# Patient Record
Sex: Male | Born: 1960 | Race: White | Hispanic: No | Marital: Single | Smoking: Never smoker
Health system: Southern US, Community
[De-identification: ages and names within clinical notes are randomized; demographics above are authoritative.]

---

## 2014-05-08 ENCOUNTER — Ambulatory Visit (INDEPENDENT_AMBULATORY_CARE_PROVIDER_SITE_OTHER): Payer: Self-pay | Admitting: General Surgery

## 2014-12-04 ENCOUNTER — Encounter: Payer: Self-pay | Admitting: Internal Medicine

## 2017-01-04 DIAGNOSIS — Z0000 Encounter for general adult medical examination without abnormal findings: Secondary | ICD-10-CM | POA: Diagnosis not present

## 2017-01-04 DIAGNOSIS — I10 Essential (primary) hypertension: Secondary | ICD-10-CM | POA: Diagnosis not present

## 2017-01-04 DIAGNOSIS — Z125 Encounter for screening for malignant neoplasm of prostate: Secondary | ICD-10-CM | POA: Diagnosis not present

## 2017-01-11 DIAGNOSIS — Z23 Encounter for immunization: Secondary | ICD-10-CM | POA: Diagnosis not present

## 2017-01-11 DIAGNOSIS — K5790 Diverticulosis of intestine, part unspecified, without perforation or abscess without bleeding: Secondary | ICD-10-CM | POA: Diagnosis not present

## 2017-01-11 DIAGNOSIS — E668 Other obesity: Secondary | ICD-10-CM | POA: Diagnosis not present

## 2017-01-11 DIAGNOSIS — Z1389 Encounter for screening for other disorder: Secondary | ICD-10-CM | POA: Diagnosis not present

## 2017-01-11 DIAGNOSIS — I10 Essential (primary) hypertension: Secondary | ICD-10-CM | POA: Diagnosis not present

## 2017-01-11 DIAGNOSIS — E7849 Other hyperlipidemia: Secondary | ICD-10-CM | POA: Diagnosis not present

## 2017-01-11 DIAGNOSIS — Z0000 Encounter for general adult medical examination without abnormal findings: Secondary | ICD-10-CM | POA: Diagnosis not present

## 2017-01-14 DIAGNOSIS — Z1212 Encounter for screening for malignant neoplasm of rectum: Secondary | ICD-10-CM | POA: Diagnosis not present

## 2017-10-30 ENCOUNTER — Emergency Department (HOSPITAL_COMMUNITY): Payer: Commercial Managed Care - PPO

## 2017-10-30 ENCOUNTER — Emergency Department (HOSPITAL_COMMUNITY)
Admission: EM | Admit: 2017-10-30 | Discharge: 2017-10-30 | Disposition: A | Discharge status: Home / Self Care | Payer: Commercial Managed Care - PPO | Attending: Emergency Medicine | Admitting: Emergency Medicine

## 2017-10-30 ENCOUNTER — Encounter (HOSPITAL_COMMUNITY): Payer: Self-pay

## 2017-10-30 ENCOUNTER — Other Ambulatory Visit: Payer: Self-pay

## 2017-10-30 DIAGNOSIS — Y929 Unspecified place or not applicable: Secondary | ICD-10-CM | POA: Diagnosis not present

## 2017-10-30 DIAGNOSIS — Y939 Activity, unspecified: Secondary | ICD-10-CM | POA: Diagnosis not present

## 2017-10-30 DIAGNOSIS — S61210A Laceration without foreign body of right index finger without damage to nail, initial encounter: Secondary | ICD-10-CM | POA: Diagnosis present

## 2017-10-30 DIAGNOSIS — Z23 Encounter for immunization: Secondary | ICD-10-CM | POA: Insufficient documentation

## 2017-10-30 DIAGNOSIS — W312XXA Contact with powered woodworking and forming machines, initial encounter: Secondary | ICD-10-CM | POA: Diagnosis not present

## 2017-10-30 DIAGNOSIS — Z79899 Other long term (current) drug therapy: Secondary | ICD-10-CM | POA: Insufficient documentation

## 2017-10-30 DIAGNOSIS — Y999 Unspecified external cause status: Secondary | ICD-10-CM | POA: Diagnosis not present

## 2017-10-30 MED ORDER — OXYCODONE-ACETAMINOPHEN 5-325 MG PO TABS: 1.0000 | ORAL_TABLET | Freq: Four times a day (QID) | ORAL | 0 refills | Status: AC | PRN

## 2017-10-30 MED ORDER — BACITRACIN ZINC 500 UNIT/GM EX OINT
TOPICAL_OINTMENT | Freq: Two times a day (BID) | CUTANEOUS | Status: DC
  Administered 2017-10-30: 1 via TOPICAL
  Filled 2017-10-30: qty 0.9

## 2017-10-30 MED ORDER — TETANUS-DIPHTH-ACELL PERTUSSIS 5-2.5-18.5 LF-MCG/0.5 IM SUSP
0.5000 mL | Freq: Once | INTRAMUSCULAR | Status: AC
  Administered 2017-10-30: 0.5 mL via INTRAMUSCULAR
  Filled 2017-10-30 (×2): qty 0.5

## 2017-10-30 MED ORDER — CEPHALEXIN 500 MG PO CAPS: 500.0000 mg | ORAL_CAPSULE | Freq: Four times a day (QID) | ORAL | 0 refills | Status: AC

## 2017-10-30 MED ORDER — LIDOCAINE HCL (PF) 1 % IJ SOLN
5.0000 mL | Freq: Once | INTRAMUSCULAR | Status: AC
  Administered 2017-10-30: 5 mL
  Filled 2017-10-30: qty 30

## 2017-10-30 NOTE — ED Notes (Signed)
Pt family member updated on status of provider suturing wound

## 2017-10-30 NOTE — Discharge Instructions (Addendum)
Please follow the wound care instructions attached to the your discharge paperwork. Please take all of your antibiotic, Keflex as prescribed. You may use the pain medication, Percocet as prescribed for severe pain, do not drive or operate heavy machinery while you take this medication because it can make you drowsy.  You may also use over-the-counter anti-inflammatory medications such as ibuprofen for pain as directed on the package. Follow-up with your primary care provider in 1 week to have your wound checked and sutures removed. Please return to the emergency department for any new or worsening symptoms.  Contact a health care provider if: You received a tetanus shot and you have swelling, severe pain, redness, or bleeding at the injection site. You have a fever. A wound that was closed breaks open. You notice a bad smell coming from the wound. You notice something coming out of the wound, such as wood or glass. Your pain is not controlled with medicine. You have increased redness, swelling, or pain at the site of your wound. You have fluid, blood, or pus coming from your wound. You notice a change in the color of your skin near your wound. You need to change the dressing frequently due to fluid, blood, or pus draining from the wound. You develop a new rash. You develop numbness around the wound. Get help right away if: You develop severe swelling around the injury site. Your pain suddenly increases and is severe. You develop painful lumps near the wound or on skin that is anywhere on your body. You have a red streak going away from your wound. The wound is on your hand or foot and you cannot properly move a finger or toe. The wound is on your hand or foot and you notice that your fingers or toes look pale or bluish.

## 2017-10-30 NOTE — ED Provider Notes (Signed)
  Face-to-face evaluation   History: Patient, and with chainsaw, accidentally today.  Isolated injury, right base of index finger. Physical exam: Alert, calm, cooperative.  Parallel lacerations radial base of the index finger.  Normal distal motion and capillary refill.  Medical screening examination/treatment/procedure(s) were conducted as a shared visit with non-physician practitioner(s) and myself.  I personally evaluated the patient during the encounter    Mancel BaleWentz, Jonita Hirota, MD 10/30/17 [id number]

## 2017-10-30 NOTE — ED Notes (Signed)
ED Provider at bedside. 

## 2017-10-30 NOTE — ED Triage Notes (Signed)
He states he lac. His right index finger while using a chainsaw this morning. He has a lac. At proximal ant. Right index finger. He is in no distress.

## 2017-10-30 NOTE — ED Provider Notes (Signed)
[facility]-EMERGENCY DEPT Provider Note   CSN: [id number] Arrival date & time: 10/30/17  1053     History   Chief Complaint Chief Complaint  Patient presents with  . Extremity Laceration    HPI Jesse Knapp is a 57 y.o. male presenting for laceration to his proximal right index finger just prior to arrival with a chainsaw.  Patient has multiple parallel lacerations to the proximal right index finger, bleeding controlled prior to arrival with direct pressure.  Patient describes pain as sharp, 5/10 in severity worsened with touching the wound and movement of the finger.  Patient states that he has not taken anything for his pain prior to arrival.  Patient denies all other injuries, denies use of blood thinner.  Patient states that he does not remember when his last tetanus shot was. HPI  No past medical history on file.  There are no active problems to display for this patient.     Home Medications    Prior to Admission medications   Medication Sig Start Date End Date Taking? Authorizing Provider  amLODipine (NORVASC) 10 MG tablet Take 5 mg by mouth daily.   Yes [provider]  aspirin (ASPIRIN 81) 81 MG EC tablet Take 81 mg by mouth daily.   Yes [provider]  aspirin 500 MG EC tablet Take 500 mg by mouth every 6 (six) hours as needed for pain.   Yes [provider]  atorvastatin (LIPITOR) 80 MG tablet Take 80 mg by mouth daily.   Yes [provider]  lisinopril (PRINIVIL,ZESTRIL) 40 MG tablet Take 40 mg by mouth daily.   Yes [provider]  Multiple Vitamin (MULTIVITAMIN) tablet Take 1 tablet by mouth daily.   Yes [provider]  cephALEXin (KEFLEX) 500 MG capsule Take 1 capsule (500 mg total) by mouth 4 (four) times daily for 7 days. 10/30/17 11/06/17  Harlene SaltsMorelli, Marsha Gundlach A, PA-C  oxyCODONE-acetaminophen (PERCOCET/ROXICET) 5-325 MG tablet Take 1 tablet by mouth every 6 (six) hours as needed for up to  3 days for severe pain. 10/30/17 11/02/17  Bill SalinasMorelli, Leonce Bale A, PA-C    Family History No family history on file.  Social History Social History   Tobacco Use  . Smoking status: Never Smoker  . Smokeless tobacco: Never Used  Substance Use Topics  . Alcohol use: Not on file  . Drug use: Not on file     Allergies   Ivp dye [iodinated diagnostic agents]   Review of Systems Review of Systems  Constitutional: Negative.  Negative for fever.  HENT: Negative.  Negative for sore throat and trouble swallowing.   Eyes: Negative.  Negative for visual disturbance.  Respiratory: Negative.  Negative for shortness of breath.   Cardiovascular: Negative.  Negative for chest pain.  Gastrointestinal: Negative.  Negative for abdominal pain, diarrhea, nausea and vomiting.  Genitourinary: Negative.  Negative for difficulty urinating and dysuria.  Musculoskeletal: Negative.  Negative for arthralgias and myalgias.  Skin: Positive for wound.  Neurological: Negative.  Negative for dizziness, syncope, weakness, numbness and headaches.    Physical Exam Updated Vital Signs BP (!) 150/89 (BP Location: Left Arm)   Pulse 75   Temp 98.5 F (36.9 C) (Oral)   Resp 16   SpO2 100%   Physical Exam  Constitutional: He is oriented to person, place, and time. He appears well-developed and well-nourished. No distress.  HENT:  Head: Normocephalic and atraumatic.  Right Ear: External ear normal.  Left Ear: External ear  normal.  Nose: Nose normal.  Mouth/Throat: Oropharynx is clear and moist.  Eyes: Pupils are equal, round, and reactive to light. EOM are normal.  Neck: Normal range of motion. Neck supple. No JVD present. No tracheal deviation present.  Cardiovascular: Normal rate, regular rhythm, normal heart sounds and intact distal pulses.  Pulmonary/Chest: Effort normal and breath sounds normal. No respiratory distress.  Abdominal: Soft. Bowel sounds are normal. There is no tenderness. There is no rebound  and no guarding.  Musculoskeletal: Normal range of motion.       Hands: 3 sets of parallel lacerations to the right proximal index finger, roughly Z shaped.  Patient with capillary refill intact to this finger, sensation intact, patient is able to extend and flex the finger.  5/5 strength with movement of this finger.  No TTP over flexor sheath.No snuffbox TTP. Finger adduction/abduction intact with 5/5 strength.  Thumb opposition intact. Full active and resisted ROM to flexion/extension at wrist, MCP, PIP and DIP of all fingers.  FDS/FDP intact. Radial artery 2+ with <2sec cap refill.  Dynamic 2-pt discrimination intact over median and ulnar nerve distributions on the ulnar/radial aspects of digits. Grip 5/5 strength.    Neurological: He is alert and oriented to person, place, and time.  Skin: Skin is warm and dry. Capillary refill takes less than 2 seconds.  Psychiatric: He has a normal mood and affect. His behavior is normal.     ED Treatments / Results  Labs (all labs ordered are listed, but only abnormal results are displayed) Labs Reviewed - No data to display  EKG None  Radiology Dg Finger Index Right  Result Date: 10/30/2017 CLINICAL DATA:  Laceration from chainsaw injury EXAM: RIGHT INDEX FINGER 2+V COMPARISON:  None. FINDINGS: Soft tissue laceration noted in the proximal right second finger, radial aspect. No radiopaque foreign body. No fracture. No dislocation. No suspicious focal osseous lesion. No significant arthropathy. IMPRESSION: Proximal right second finger soft tissue laceration, with no radiopaque foreign body, fracture or malalignment. Electronically Signed   By: Delbert Phenix M.D.   On: 10/30/2017 13:16    Procedures .Marland KitchenLaceration Repair Date/Time: 10/30/2017 3:42 PM Performed by: Bill Salinas, PA-C Authorized by: Bill Salinas, PA-C   Consent:    Consent obtained:  Verbal   Consent given by:  Patient   Risks discussed:  Infection, pain, retained  foreign body, tendon damage, poor cosmetic result, need for additional repair, poor wound healing, nerve damage and vascular damage Anesthesia (see MAR for exact dosages):    Anesthesia method:  Nerve block and local infiltration   Local anesthetic:  Lidocaine 1% w/o epi   Block location:  Wound   Block needle gauge:  27 G   Block anesthetic:  Lidocaine 1% w/o epi   Block technique:  Digital   Block injection procedure:  Anatomic landmarks identified, introduced needle, incremental injection, negative aspiration for blood and anatomic landmarks palpated   Block outcome:  Incomplete block Laceration details:    Location:  Finger   Finger location:  R index finger   Length (cm):  6 (2cm, 1.5cm, 1.5cm, 1cm)   Depth (mm):  3 Repair type:    Repair type:  Intermediate Pre-procedure details:    Preparation:  Patient was prepped and draped in usual sterile fashion and imaging obtained to evaluate for foreign bodies Exploration:    Hemostasis achieved with:  Direct pressure   Wound exploration: wound explored through full range of motion and entire depth of wound probed  and visualized     Wound extent: no foreign bodies/material noted, no muscle damage noted, no nerve damage noted, no tendon damage noted, no underlying fracture noted and no vascular damage noted     Contaminated: yes   Treatment:    Area cleansed with:  Saline   Amount of cleaning:  Extensive   Irrigation solution:  Sterile saline   Irrigation volume:  1 L   Irrigation method:  Pressure wash   Visualized foreign bodies/material removed: no   Skin repair:    Repair method:  Sutures   Suture size:  4-0   Suture material:  Prolene   Number of sutures:  6 Approximation:    Approximation:  Close Post-procedure details:    Dressing:  Antibiotic ointment, non-adherent dressing and sterile dressing   Patient tolerance of procedure:  Tolerated well, no immediate complications Comments:     Initial digital block only  partially successful, still needed to inject approximately 1 cc into the wound edges. This wound was complicated by the fact that it was due to chainsaw, multiple parallel lacerations grouped closely together in a Z shaped pattern. I was able to approximate the parallel lacerations with stitch through both.  Each set of lacerations required 2 sutures each crossing both parallel lacerations and then tied.  Dr.Wentz observed mid procedure and approved of technique. Patient was cleaned after procedure by nursing staff.  Bleeding still controlled at this time.  Patient with full range of motion, flexion and extension post procedure.  Patient with capillary refill intact post procedure. Patient with full sensation to the tip of his right index finger post procedure.  Patient expresses satisfaction with laceration repair.    Medications Ordered in ED Medications  bacitracin ointment (1 application Topical Given 10/30/17 1522)  lidocaine (PF) (XYLOCAINE) 1 % injection 5 mL (5 mLs Infiltration Given by Other 10/30/17 1257)  Tdap (BOOSTRIX) injection 0.5 mL (0.5 mLs Intramuscular Given 10/30/17 1522)     Initial Impression / Assessment and Plan / ED Course  I have reviewed the triage vital signs and the nursing notes.  Pertinent labs & imaging results that were available during my care of the patient were reviewed by me and considered in my medical decision making (see chart for details).    Jesse Knapp is a 57 y.o. male who presents to ED for laceration of the right proximal index finger. Wound thoroughly cleaned in ED today. Wound explored and bottom of wound seen in a bloodless field. Laceration repaired as dictated above. Patient counseled on home wound care. Follow up with PCP/urgent care or return to ER for suture removal in 7 days. Patient was urged to return to the Emergency Department for worsening pain, swelling, expanding erythema especially if it streaks away from the affected area, fever, or  for any additional concerns. Patient verbalized understanding of return precautions. All questions answered. Patient prescribed antibiotic medication Keflex for wound prophylaxis due to inherently dirty wound.  Chainsaw.  Patient also prescribed short prescription for Percocet for pain. Patient given tetanus shot in department today.  At this time there does not appear to be any evidence of an acute emergency medical condition and the patient appears stable for discharge with appropriate outpatient follow up. Diagnosis was discussed with patient who verbalizes understanding and is agreeable to discharge. I have discussed return precautions with patient and family who verbalize understanding of return precautions. Patient strongly encouraged to follow-up with their PCP.  Patient's case discussed with Dr. Effie Shy  who agrees with plan to discharge with follow-up.     This note was dictated using DragonOne dictation software; please contact for any inconsistencies within the note.    Final Clinical Impressions(s) / ED Diagnoses   Final diagnoses:  Laceration of right index finger without foreign body without damage to nail, initial encounter    ED Discharge Orders        Ordered    oxyCODONE-acetaminophen (PERCOCET/ROXICET) 5-325 MG tablet  Every 6 hours PRN     10/30/17 1512    cephALEXin (KEFLEX) 500 MG capsule  4 times daily     10/30/17 1512       Elizabeth Palau 10/30/17 1553    Mancel Bale, MD 10/30/17 [phone]

## 2018-08-18 DIAGNOSIS — I10 Essential (primary) hypertension: Secondary | ICD-10-CM | POA: Diagnosis not present

## 2018-08-21 DIAGNOSIS — Z1331 Encounter for screening for depression: Secondary | ICD-10-CM | POA: Diagnosis not present

## 2018-08-21 DIAGNOSIS — K5790 Diverticulosis of intestine, part unspecified, without perforation or abscess without bleeding: Secondary | ICD-10-CM | POA: Diagnosis not present

## 2018-08-21 DIAGNOSIS — E669 Obesity, unspecified: Secondary | ICD-10-CM | POA: Diagnosis not present

## 2018-08-21 DIAGNOSIS — E785 Hyperlipidemia, unspecified: Secondary | ICD-10-CM | POA: Diagnosis not present

## 2018-08-21 DIAGNOSIS — I10 Essential (primary) hypertension: Secondary | ICD-10-CM | POA: Diagnosis not present

## 2018-10-02 DIAGNOSIS — E669 Obesity, unspecified: Secondary | ICD-10-CM | POA: Diagnosis not present

## 2018-10-02 DIAGNOSIS — I10 Essential (primary) hypertension: Secondary | ICD-10-CM | POA: Diagnosis not present

## 2018-10-02 DIAGNOSIS — R252 Cramp and spasm: Secondary | ICD-10-CM | POA: Diagnosis not present

## 2018-11-09 DIAGNOSIS — E785 Hyperlipidemia, unspecified: Secondary | ICD-10-CM | POA: Diagnosis not present

## 2018-11-09 DIAGNOSIS — I10 Essential (primary) hypertension: Secondary | ICD-10-CM | POA: Diagnosis not present

## 2018-11-09 DIAGNOSIS — E669 Obesity, unspecified: Secondary | ICD-10-CM | POA: Diagnosis not present

## 2018-12-08 DIAGNOSIS — Z23 Encounter for immunization: Secondary | ICD-10-CM | POA: Diagnosis not present

## 2019-03-08 DIAGNOSIS — Z125 Encounter for screening for malignant neoplasm of prostate: Secondary | ICD-10-CM | POA: Diagnosis not present

## 2019-03-08 DIAGNOSIS — E7849 Other hyperlipidemia: Secondary | ICD-10-CM | POA: Diagnosis not present

## 2019-03-14 DIAGNOSIS — I10 Essential (primary) hypertension: Secondary | ICD-10-CM | POA: Diagnosis not present

## 2019-03-14 DIAGNOSIS — R82998 Other abnormal findings in urine: Secondary | ICD-10-CM | POA: Diagnosis not present

## 2019-03-15 DIAGNOSIS — Z1331 Encounter for screening for depression: Secondary | ICD-10-CM | POA: Diagnosis not present

## 2019-03-15 DIAGNOSIS — I10 Essential (primary) hypertension: Secondary | ICD-10-CM | POA: Diagnosis not present

## 2019-03-15 DIAGNOSIS — R972 Elevated prostate specific antigen [PSA]: Secondary | ICD-10-CM | POA: Diagnosis not present

## 2019-03-15 DIAGNOSIS — E785 Hyperlipidemia, unspecified: Secondary | ICD-10-CM | POA: Diagnosis not present

## 2019-03-15 DIAGNOSIS — E669 Obesity, unspecified: Secondary | ICD-10-CM | POA: Diagnosis not present

## 2019-03-15 DIAGNOSIS — Z0000 Encounter for general adult medical examination without abnormal findings: Secondary | ICD-10-CM | POA: Diagnosis not present

## 2019-03-28 DIAGNOSIS — Z1212 Encounter for screening for malignant neoplasm of rectum: Secondary | ICD-10-CM | POA: Diagnosis not present

## 2019-06-23 ENCOUNTER — Ambulatory Visit: Payer: BC Managed Care – PPO | Attending: Internal Medicine

## 2019-06-23 DIAGNOSIS — Z23 Encounter for immunization: Secondary | ICD-10-CM

## 2019-06-23 NOTE — Progress Notes (Signed)
   Covid-19 Vaccination Clinic  Name:  Jesse Knapp    MRN: [id number] DOB: 12/17/1960  06/23/2019  [name] was observed post Covid-19 immunization for 15 minutes without incident. He was provided with Vaccine Information Sheet and instruction to access the V-Safe system.   [name] was instructed to call 911 with any severe reactions post vaccine: Marland Kitchen Difficulty breathing  . Swelling of face and throat  . A fast heartbeat  . A bad rash all over body  . Dizziness and weakness   Immunizations Administered    Name Date Dose VIS Date Route   Moderna COVID-19 Vaccine 06/23/2019  9:17 AM 0.5 mL 03/06/2019 Intramuscular   Manufacturer: Moderna   Lot: [id number]   NDC: 33007-622-63

## 2019-07-25 ENCOUNTER — Ambulatory Visit: Payer: BC Managed Care – PPO | Attending: Internal Medicine

## 2019-07-25 DIAGNOSIS — Z23 Encounter for immunization: Secondary | ICD-10-CM

## 2019-07-25 NOTE — Progress Notes (Signed)
   Covid-19 Vaccination Clinic  Name:  Jesse Knapp    MRN: [id number] DOB: Mar 06, 1961  07/25/2019  Jesse Knapp was observed post Covid-19 immunization for 15 minutes without incident. He was provided with Vaccine Information Sheet and instruction to access the V-Safe system.   Jesse Knapp was instructed to call 911 with any severe reactions post vaccine: Marland Kitchen Difficulty breathing  . Swelling of face and throat  . A fast heartbeat  . A bad rash all over body  . Dizziness and weakness   Immunizations Administered    Name Date Dose VIS Date Route   Moderna COVID-19 Vaccine 07/25/2019  9:13 AM 0.5 mL 03/2019 Intramuscular   Manufacturer: Moderna   Lot: [id number]   NDC: 74715-953-96

## 2019-09-11 DIAGNOSIS — E785 Hyperlipidemia, unspecified: Secondary | ICD-10-CM | POA: Diagnosis not present

## 2019-09-11 DIAGNOSIS — I10 Essential (primary) hypertension: Secondary | ICD-10-CM | POA: Diagnosis not present

## 2019-09-11 DIAGNOSIS — E669 Obesity, unspecified: Secondary | ICD-10-CM | POA: Diagnosis not present

## 2019-09-11 DIAGNOSIS — R972 Elevated prostate specific antigen [PSA]: Secondary | ICD-10-CM | POA: Diagnosis not present

## 2019-09-20 DIAGNOSIS — M25512 Pain in left shoulder: Secondary | ICD-10-CM | POA: Diagnosis not present

## 2019-09-20 DIAGNOSIS — M7542 Impingement syndrome of left shoulder: Secondary | ICD-10-CM | POA: Diagnosis not present

## 2020-01-12 DIAGNOSIS — Z23 Encounter for immunization: Secondary | ICD-10-CM | POA: Diagnosis not present

## 2020-01-28 IMAGING — DX DG FINGER INDEX 2+V*R*
3 series · 3 of 3 positions shown · non-contrast
Comparison: None.

CLINICAL DATA: Laceration from chainsaw injury

EXAM:
RIGHT INDEX FINGER 2+V

[finger ap]
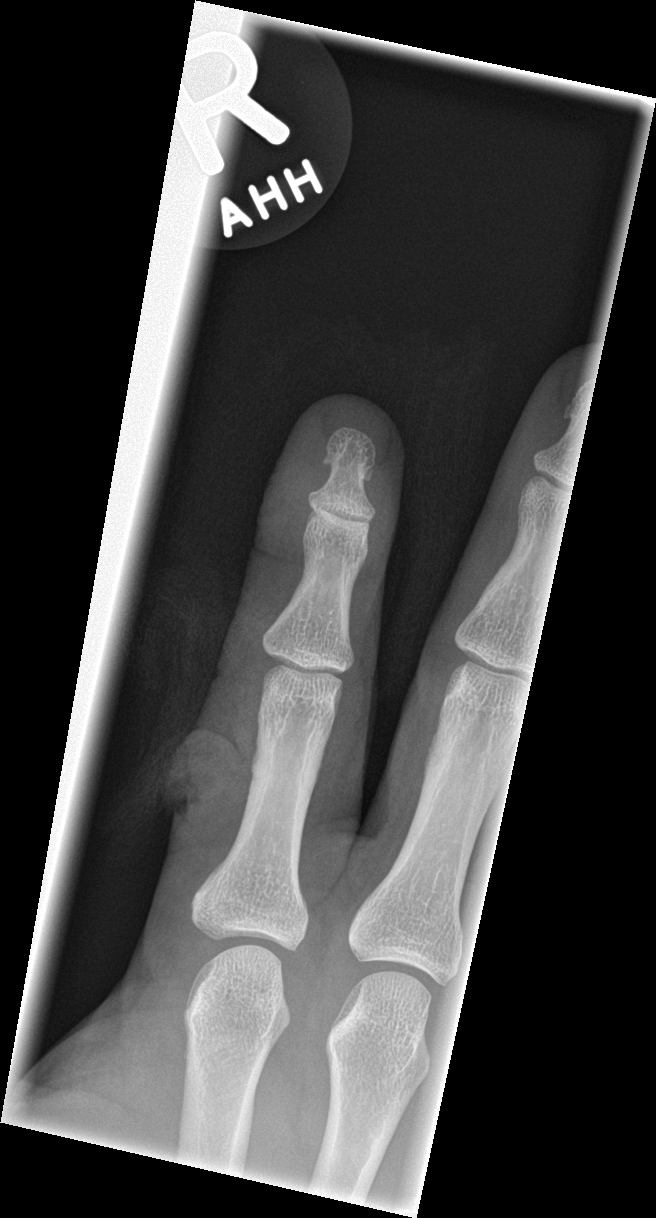

[finger obl]
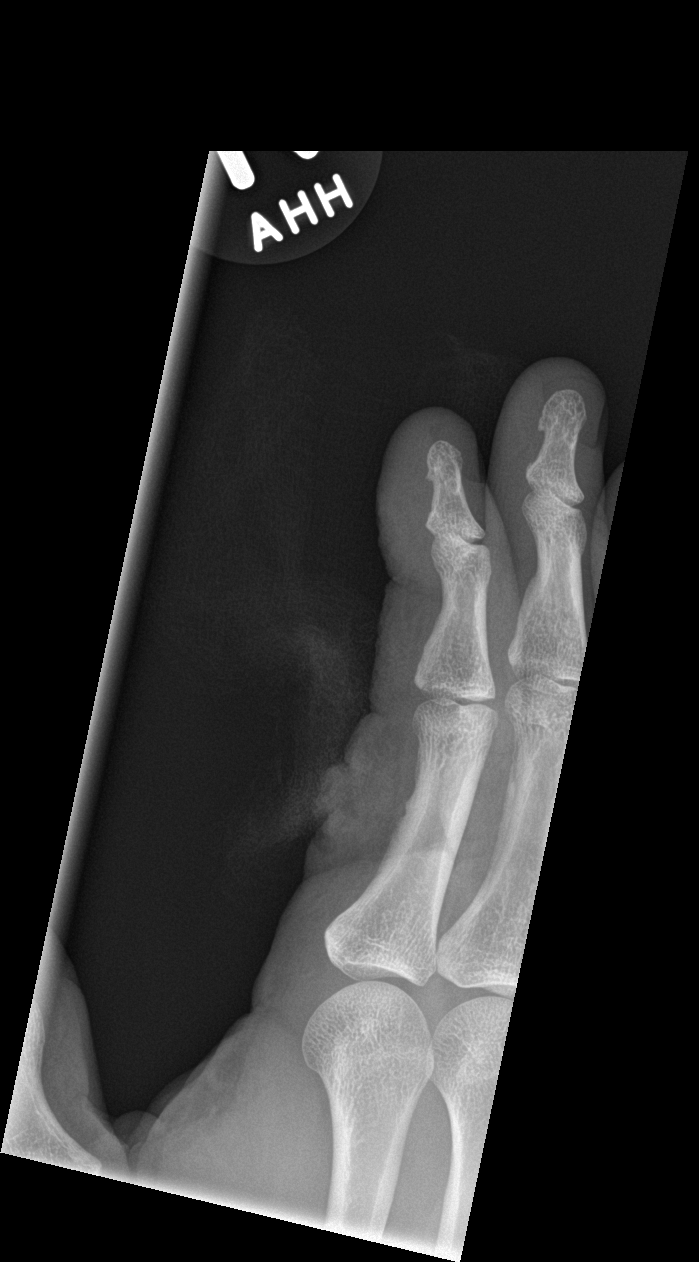

[finger lat]
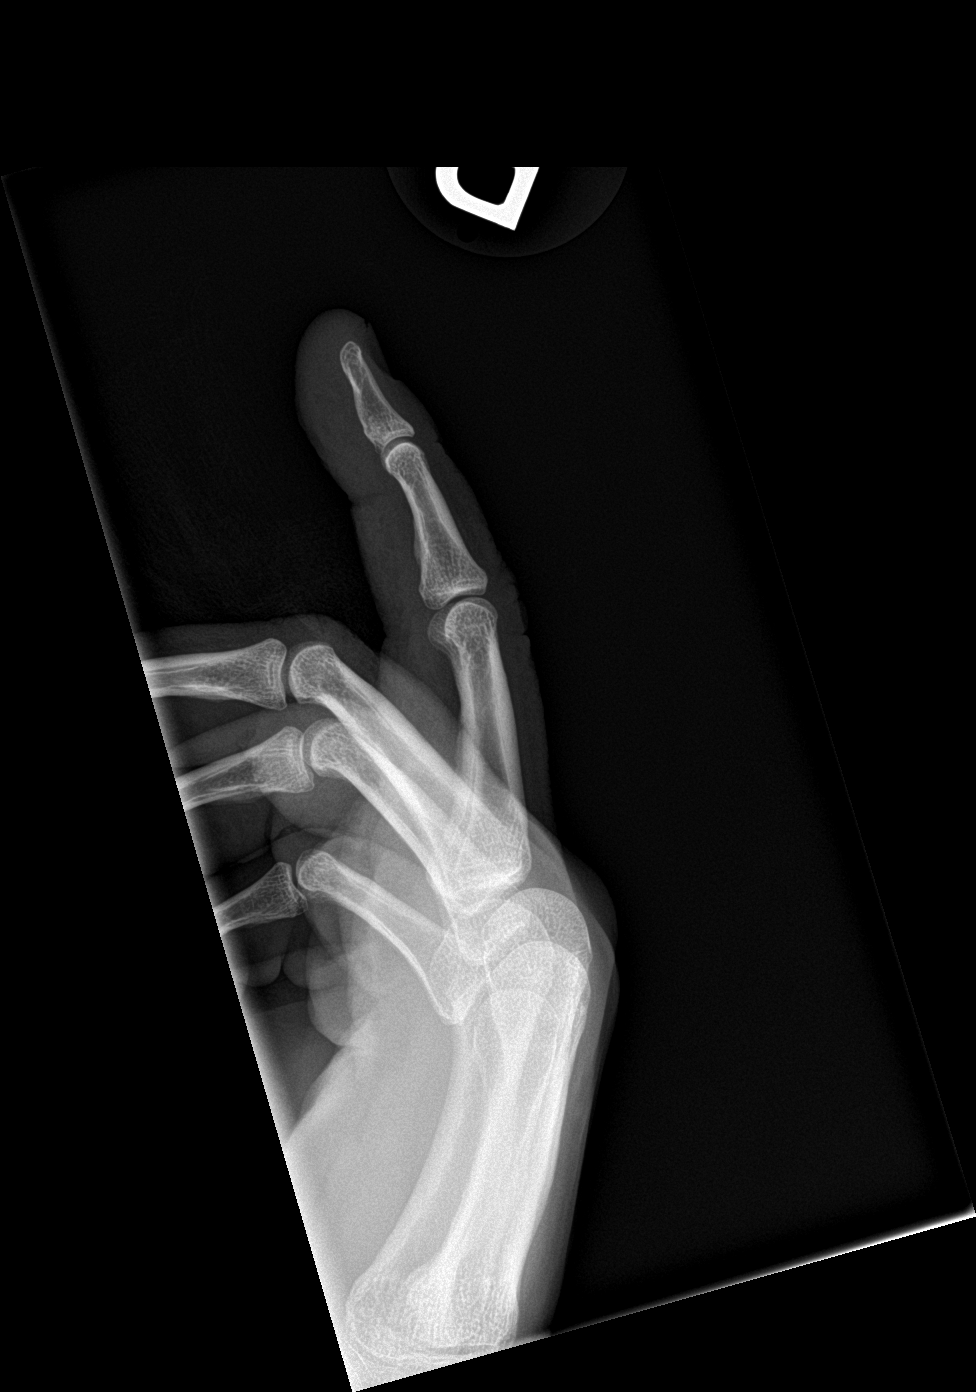

[3 of 3 positions shown; findings below may reference images not displayed]

FINDINGS: Soft tissue laceration noted in the proximal right second finger,
radial aspect. No radiopaque foreign body. No fracture. No
dislocation. No suspicious focal osseous lesion. No significant
arthropathy.
IMPRESSION: Proximal right second finger soft tissue laceration, with no
radiopaque foreign body, fracture or malalignment.

## 2020-03-11 ENCOUNTER — Ambulatory Visit: Payer: BC Managed Care – PPO | Attending: Internal Medicine

## 2020-03-11 ENCOUNTER — Other Ambulatory Visit (HOSPITAL_BASED_OUTPATIENT_CLINIC_OR_DEPARTMENT_OTHER): Payer: Self-pay | Admitting: Internal Medicine

## 2020-03-11 DIAGNOSIS — Z23 Encounter for immunization: Secondary | ICD-10-CM

## 2020-03-11 NOTE — Progress Notes (Signed)
   Covid-19 Vaccination Clinic  Name:  HERMINIO KNISKERN    MRN: [id number] DOB: [birth year]  03/11/2020  Mr. Stayer was observed post Covid-19 immunization for 15 minutes without incident. He was provided with Vaccine Information Sheet and instruction to access the V-Safe system.   Mr. Bunton was instructed to call 911 with any severe reactions post vaccine: Marland Kitchen Difficulty breathing  . Swelling of face and throat  . A fast heartbeat  . A bad rash all over body  . Dizziness and weakness   Immunizations Administered    No immunizations on file.

## 2020-03-18 MED FILL — MODERNA COVID-19 VACCINE 10: 100 | 1 days supply | Qty: 0 | Fill #0

## 2020-04-07 DIAGNOSIS — E785 Hyperlipidemia, unspecified: Secondary | ICD-10-CM | POA: Diagnosis not present

## 2020-04-07 DIAGNOSIS — Z125 Encounter for screening for malignant neoplasm of prostate: Secondary | ICD-10-CM | POA: Diagnosis not present

## 2020-04-14 DIAGNOSIS — Z0000 Encounter for general adult medical examination without abnormal findings: Secondary | ICD-10-CM | POA: Diagnosis not present

## 2020-04-14 DIAGNOSIS — R82998 Other abnormal findings in urine: Secondary | ICD-10-CM | POA: Diagnosis not present

## 2020-04-14 DIAGNOSIS — E785 Hyperlipidemia, unspecified: Secondary | ICD-10-CM | POA: Diagnosis not present

## 2020-04-14 DIAGNOSIS — I10 Essential (primary) hypertension: Secondary | ICD-10-CM | POA: Diagnosis not present

## 2020-04-22 DIAGNOSIS — Z1212 Encounter for screening for malignant neoplasm of rectum: Secondary | ICD-10-CM | POA: Diagnosis not present

## 2020-08-25 DIAGNOSIS — M25512 Pain in left shoulder: Secondary | ICD-10-CM | POA: Diagnosis not present

## 2020-09-25 DIAGNOSIS — M7542 Impingement syndrome of left shoulder: Secondary | ICD-10-CM | POA: Diagnosis not present

## 2020-09-25 DIAGNOSIS — M47812 Spondylosis without myelopathy or radiculopathy, cervical region: Secondary | ICD-10-CM | POA: Diagnosis not present
